# Patient Record
Sex: Male | Born: 1990 | Race: Black or African American | Hispanic: No | Marital: Single | Smoking: Current some day smoker
Health system: Southern US, Community
[De-identification: ages and names within clinical notes are randomized; demographics above are authoritative.]

---
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
## 2010-10-28 LAB — RAPID STREP SCREEN (MED CTR MEBANE ONLY): Streptococcus, Group A Screen (Direct): NEGATIVE

## 2016-02-16 ENCOUNTER — Emergency Department (HOSPITAL_COMMUNITY): Payer: Self-pay

## 2016-02-16 ENCOUNTER — Encounter (HOSPITAL_COMMUNITY): Payer: Self-pay | Admitting: Emergency Medicine

## 2016-02-16 ENCOUNTER — Emergency Department (HOSPITAL_COMMUNITY)
Admission: EM | Admit: 2016-02-16 | Discharge: 2016-02-16 | Disposition: A | Discharge status: Home / Self Care | Payer: Self-pay | Attending: Physician Assistant | Admitting: Physician Assistant

## 2016-02-16 DIAGNOSIS — N50811 Right testicular pain: Secondary | ICD-10-CM | POA: Insufficient documentation

## 2016-02-16 DIAGNOSIS — N50819 Testicular pain, unspecified: Secondary | ICD-10-CM

## 2016-02-16 DIAGNOSIS — N50812 Left testicular pain: Secondary | ICD-10-CM | POA: Insufficient documentation

## 2016-02-16 DIAGNOSIS — N452 Orchitis: Secondary | ICD-10-CM | POA: Insufficient documentation

## 2016-02-16 DIAGNOSIS — F17210 Nicotine dependence, cigarettes, uncomplicated: Secondary | ICD-10-CM | POA: Insufficient documentation

## 2016-02-16 LAB — URINALYSIS, ROUTINE W REFLEX MICROSCOPIC
Bilirubin Urine: NEGATIVE
Glucose, UA: NEGATIVE mg/dL
Hgb urine dipstick: NEGATIVE
Ketones, ur: NEGATIVE mg/dL
Leukocytes, UA: NEGATIVE
Nitrite: NEGATIVE
Protein, ur: NEGATIVE mg/dL
Specific Gravity, Urine: 1.024 (ref 1.005–1.030)
pH: 7 (ref 5.0–8.0)

## 2016-02-16 MED ORDER — CEFTRIAXONE SODIUM 250 MG IJ SOLR
250.0000 mg | Freq: Once | INTRAMUSCULAR | Status: AC
  Administered 2016-02-16: 250 mg via INTRAMUSCULAR
  Filled 2016-02-16: qty 250

## 2016-02-16 MED ORDER — LIDOCAINE HCL (PF) 1 % IJ SOLN
INTRAMUSCULAR | Status: AC
  Administered 2016-02-16: 5 mL
  Filled 2016-02-16: qty 5

## 2016-02-16 MED ORDER — DOXYCYCLINE HYCLATE 100 MG PO CAPS: 100.0000 mg | ORAL_CAPSULE | Freq: Two times a day (BID) | ORAL | 0 refills | Status: AC

## 2016-02-16 MED ORDER — AZITHROMYCIN 250 MG PO TABS
1000.0000 mg | ORAL_TABLET | Freq: Once | ORAL | Status: AC
  Administered 2016-02-16: 1000 mg via ORAL
  Filled 2016-02-16: qty 4

## 2016-02-16 NOTE — ED Provider Notes (Signed)
Patient with testicle pain.  Recently treated for STI.  US shows possible orchitis.  Given recent STI, doubt viral process.  Will give azithro, rocephin, and doxy.  Recommend [facility] Department follow-up.  Results for orders placed or performed during the hospital encounter of 02/16/16  Urinalysis, Routine w reflex microscopic  Result Value Ref Range   Color, Urine YELLOW YELLOW   APPearance CLEAR CLEAR   Specific Gravity, Urine 1.024 1.005 - 1.030   pH 7.0 5.0 - 8.0   Glucose, UA NEGATIVE NEGATIVE mg/dL   Hgb urine dipstick NEGATIVE NEGATIVE   Bilirubin Urine NEGATIVE NEGATIVE   Ketones, ur NEGATIVE NEGATIVE mg/dL   Protein, ur NEGATIVE NEGATIVE mg/dL   Nitrite NEGATIVE NEGATIVE   Leukocytes, UA NEGATIVE NEGATIVE   [place] Scrotum  Result Date: 02/16/2016 CLINICAL DATA:  Bilateral testicular pain for 3 days EXAM: SCROTAL ULTRASOUND DOPPLER ULTRASOUND OF THE TESTICLES TECHNIQUE: Complete ultrasound examination of the testicles, epididymis, and other scrotal structures was performed. Color and spectral Doppler ultrasound were also utilized to evaluate blood flow to the testicles. COMPARISON:  None. FINDINGS: Right testicle Measurements: 4.3 x 2.2 x 3.8 cm. No mass or microlithiasis visualized. Mild increased Doppler flow in the right testicle relative to the left as can be seen with mild orchitis. Left testicle Measurements: 4.5 x 1.8 x 2.4 cm. Two punctate calcification in the left testicle. No mass or microlithiasis visualized. Right epididymis:  Normal in size and appearance. Left epididymis:  Normal in size and appearance. Hydrocele:  None visualized. Varicocele:  None visualized. Pulsed Doppler interrogation of both testes demonstrates normal low resistance arterial and venous waveforms bilaterally. IMPRESSION: 1. No testicular torsion. 2. No testicular hematoma or abscess. 3. Mild increased Doppler flow in the right testicle relative to the left as can be seen with mild orchitis. Electronically  Signed   By: Elige KoHetal  Patel   On: 02/16/2016 17:12   [place] Art/ven Flow Abd Pelv Doppler  Result Date: 02/16/2016 CLINICAL DATA:  Bilateral testicular pain for 3 days EXAM: SCROTAL ULTRASOUND DOPPLER ULTRASOUND OF THE TESTICLES TECHNIQUE: Complete ultrasound examination of the testicles, epididymis, and other scrotal structures was performed. Color and spectral Doppler ultrasound were also utilized to evaluate blood flow to the testicles. COMPARISON:  None. FINDINGS: Right testicle Measurements: 4.3 x 2.2 x 3.8 cm. No mass or microlithiasis visualized. Mild increased Doppler flow in the right testicle relative to the left as can be seen with mild orchitis. Left testicle Measurements: 4.5 x 1.8 x 2.4 cm. Two punctate calcification in the left testicle. No mass or microlithiasis visualized. Right epididymis:  Normal in size and appearance. Left epididymis:  Normal in size and appearance. Hydrocele:  None visualized. Varicocele:  None visualized. Pulsed Doppler interrogation of both testes demonstrates normal low resistance arterial and venous waveforms bilaterally. IMPRESSION: 1. No testicular torsion. 2. No testicular hematoma or abscess. 3. Mild increased Doppler flow in the right testicle relative to the left as can be seen with mild orchitis. Electronically Signed   By: Elige KoHetal  Patel   On: 02/16/2016 17:12      Roxy Horsemanobert Makaylynn Bonillas, PA-C 02/16/16 1744    Courteney Lyn Mackuen, MD 02/17/16 [id number]

## 2016-02-16 NOTE — ED Triage Notes (Signed)
Was treated for std on 1/2.  That appears to have cleared per report but this is day two of pain in testicles.  Denies swelling, denies difficulty urinating.  Has not had sex since treatment.

## 2016-02-16 NOTE — ED Notes (Signed)
Declined W/C at D/C and was escorted to lobby by RN. 

## 2016-02-16 NOTE — ED Provider Notes (Signed)
[facility]-EMERGENCY DEPT Provider Note   CSN: [id number] Arrival date & time: 02/16/16  1320    By signing my name below, I, Daniel Figueroa, attest that this documentation has been prepared under the direction and in the presence of Daniel FarrierWilliam Oluwasemilore Pascuzzi, PA-C. Electronically Signed: Valentino SaxonBianca Figueroa, ED Scribe. 02/16/16. 3:31 PM.  History   Chief Complaint Chief Complaint  Patient presents with  . Testicle Pain   The history is provided by the patient. No language interpreter was used.   HPI Comments: Laural GoldenKenae J Figueroa is a 26 y.o. male who presents to the Emergency Department complaining of moderate, constant, bilateral testicular pain onset two days ago. Pt notes he [place] Zithromax on 01/28/16.  Pt describes his pain as if "someone kicked him". He reports having a tingling feeling at the tip of his penis when urinating.  Denies difficulty urinating,  fever, chills, rashes, lesions, abdominal pain, vomiting, nausea, sores in mouth.   History reviewed. No pertinent past medical history.  There are no active problems to display for this patient.   No past surgical history on file.     Home Medications    Prior to Admission medications   Not on File    Family History No family history on file.  Social History Social History  Substance Use Topics  . Smoking status: Current Some Day Smoker    Types: Cigarettes, Cigars  . Smokeless tobacco: Not on file  . Alcohol use Yes     Comment: on occ  weekends usually     Allergies   Patient has no allergy information on record.   Review of Systems Review of Systems  Constitutional: Negative for chills and fever.  HENT: Negative for mouth sores.   Gastrointestinal: Negative for abdominal pain, nausea and vomiting.  Genitourinary: Positive for testicular pain. Negative for decreased urine volume, difficulty urinating, discharge, dysuria, flank pain, frequency, genital sores, hematuria, penile  pain and urgency.  Skin: Negative for rash.     Physical Exam Updated Vital Signs BP 127/89 (BP Location: Right Arm)   Pulse 82   Temp 98.2 F (36.8 C) (Oral)   Resp 16   Ht 5\' 11"  (1.803 m)   Wt 155 lb (70.3 kg)   SpO2 100%   BMI 21.62 kg/m   Physical Exam  Constitutional: He appears well-developed and well-nourished. No distress.  HENT:  Head: Normocephalic and atraumatic.  Mouth/Throat: Oropharynx is clear and moist.  Eyes: Right eye exhibits no discharge. Left eye exhibits no discharge.  Cardiovascular: Normal rate, regular rhythm and intact distal pulses.   Pulmonary/Chest: Effort normal. No respiratory distress.  Abdominal: Soft. He exhibits no mass. There is no tenderness. There is no guarding.  Abdomen soft nontender to palpation.  Genitourinary: Penis normal. No penile tenderness.  Genitourinary Comments: GU exam with male scribe chaperone. No external lesions or rashes noted. Penis is nontender to palpation. Testicles are nontender to palpation. Tenderness is nontender to palpation. Cremasteric reflex is present. No penile discharge noted.  Neurological: He is alert. Coordination normal.  Skin: Skin is warm and dry. Capillary refill takes less than 2 seconds. No rash noted. He is not diaphoretic. No erythema. No pallor.  Psychiatric: He has a normal mood and affect. His behavior is normal.  Nursing note and vitals reviewed.    ED Treatments / Results   DIAGNOSTIC STUDIES: Oxygen Saturation is 100% on RA, normal by my interpretation.    COORDINATION OF CARE: 3:20 PM Discussed  treatment plan with pt at bedside which includes labs and [place] and pt agreed to plan.   Labs (all labs ordered are listed, but only abnormal results are displayed) Labs Reviewed  URINALYSIS, ROUTINE W REFLEX MICROSCOPIC  GC/CHLAMYDIA PROBE AMP ([place]) NOT AT [facility]    EKG  EKG Interpretation None       Radiology No results found.  Procedures Procedures (including  critical care time)  Medications Ordered in ED Medications - No data to display   Initial Impression / Assessment and Plan / ED Course  I have reviewed the triage vital signs and the nursing notes.  Pertinent labs & imaging results that were available during my care of the patient were reviewed by me and considered in my medical decision making (see chart for details).    This is a 26 y.o. male who presents to the Emergency Department complaining of moderate, constant, bilateral testicular pain onset two days ago. Pt notes he was treated for chlamaydia in [place] with Zithromax on 01/28/16.  Pt describes his pain as if "someone kicked him". He reports having a tingling feeling at the tip of his penis when urinating. On exam the patient is afebrile nontoxic appearing. GU exam is unremarkable. No penile discharge. No testicular tenderness to palpation. Epididymis is nontender bilaterally. Cremasteric reflexes present. I saw the patient for gonorrhea and chlamydia and sent this for testing. Will order urinalysis and scrotal ultrasound.  At shift change patient is awaiting urinalysis and ultrasound. Patient care signed out to Daniel Horseman, PA-C at shift change. He will disposition the patient accordingly.  Final Clinical Impressions(s) / ED Diagnoses   Final diagnoses:  Testicle pain  Testicle pain    New Prescriptions New Prescriptions   No medications on file    I personally performed the services described in this documentation, which was scribed in my presence. The recorded information has been reviewed and is accurate.       Daniel Farrier, PA-C 02/17/16 1610    Daniel Randall An, MD 02/17/16 [id number]

## 2016-02-16 NOTE — ED Notes (Signed)
Handed Will, PA genital swab for procedure

## 2016-02-17 LAB — GC/CHLAMYDIA PROBE AMP (~~LOC~~) NOT AT ARMC
Chlamydia: POSITIVE — AB
Neisseria Gonorrhea: NEGATIVE

## 2017-12-22 ENCOUNTER — Encounter (HOSPITAL_COMMUNITY): Payer: Self-pay

## 2017-12-22 ENCOUNTER — Emergency Department (HOSPITAL_COMMUNITY)
Admission: EM | Admit: 2017-12-22 | Discharge: 2017-12-23 | Disposition: A | Discharge status: Home / Self Care | Payer: Self-pay | Attending: Emergency Medicine | Admitting: Emergency Medicine

## 2017-12-22 DIAGNOSIS — Y990 Civilian activity done for income or pay: Secondary | ICD-10-CM | POA: Insufficient documentation

## 2017-12-22 DIAGNOSIS — S76211A Strain of adductor muscle, fascia and tendon of right thigh, initial encounter: Secondary | ICD-10-CM | POA: Insufficient documentation

## 2017-12-22 DIAGNOSIS — F17210 Nicotine dependence, cigarettes, uncomplicated: Secondary | ICD-10-CM | POA: Insufficient documentation

## 2017-12-22 DIAGNOSIS — Z79899 Other long term (current) drug therapy: Secondary | ICD-10-CM | POA: Insufficient documentation

## 2017-12-22 DIAGNOSIS — Y9389 Activity, other specified: Secondary | ICD-10-CM | POA: Insufficient documentation

## 2017-12-22 DIAGNOSIS — X500XXA Overexertion from strenuous movement or load, initial encounter: Secondary | ICD-10-CM | POA: Insufficient documentation

## 2017-12-22 DIAGNOSIS — Y929 Unspecified place or not applicable: Secondary | ICD-10-CM | POA: Insufficient documentation

## 2017-12-22 DIAGNOSIS — T148XXA Other injury of unspecified body region, initial encounter: Secondary | ICD-10-CM

## 2017-12-22 NOTE — ED Triage Notes (Signed)
Pt states that for the past month he has had pain on and off to his r groin area after pulling a muscle, denies dysuria. States that his friend had a hernia and he wants to make sure he does not have the same thing

## 2017-12-23 MED ORDER — IBUPROFEN 800 MG PO TABS: 800.0000 mg | ORAL_TABLET | Freq: Three times a day (TID) | ORAL | 0 refills | Status: AC

## 2017-12-23 NOTE — ED Notes (Signed)
Patient verbalizes understanding of medications and discharge instructions. No further questions at this time. VSS and patient ambulatory at discharge.   

## 2017-12-23 NOTE — ED Provider Notes (Signed)
[facility] EMERGENCY DEPARTMENT Provider Note   CSN: [id number] Arrival date & time: 12/22/17  2323     History   Chief Complaint Chief Complaint  Patient presents with  . Groin Pain    HPI Daniel Figueroa is a 27 y.o. male.  Patient presents to the emergency department with chief complaint of right-sided groin and hip pain.  Patient states that he felt like he strained a muscle while working approximately 1 month ago.  Reports having increased pain with movement and palpation.  States that his work requires him to be active and lifting things.  He denies any dysuria or hematuria.  Denies any flank pain.  States that he is concerned because his friend had a hernia and he wants to make certain he does not have a hernia.  The history is provided by the patient. No language interpreter was used.    History reviewed. No pertinent past medical history.  There are no active problems to display for this patient.   History reviewed. No pertinent surgical history.      Home Medications    Prior to Admission medications   Medication Sig Start Date End Date Taking? Authorizing Provider  doxycycline (VIBRAMYCIN) 100 MG capsule Take 1 capsule (100 mg total) by mouth 2 (two) times daily. 02/16/16   Roxy Horseman, PA-C    Family History No family history on file.  Social History Social History   Tobacco Use  . Smoking status: Current Some Day Smoker    Types: Cigarettes, Cigars  . Smokeless tobacco: Never Used  Substance Use Topics  . Alcohol use: Yes    Comment: on occ  weekends usually  . Drug use: Yes    Types: Marijuana     Allergies   Patient has no known allergies.   Review of Systems Review of Systems  All other systems reviewed and are negative.    Physical Exam Updated Vital Signs BP 119/83   Pulse (!) 58   Temp 98 F (36.7 C) (Oral)   Resp 20   SpO2 100%   Physical Exam  Constitutional: He is oriented to person, place, and  time. He appears well-developed and well-nourished.  HENT:  Head: Normocephalic and atraumatic.  Eyes: Conjunctivae and EOM are normal.  Neck: Normal range of motion.  Cardiovascular: Normal rate.  Pulmonary/Chest: Effort normal.  Abdominal: He exhibits no distension.  No focal abdominal tenderness  Genitourinary:  Genitourinary Comments: Chaperone present for exam, normal anatomy, no masses, no hernias, no lymphadenopathy  Musculoskeletal: Normal range of motion.  Neurological: He is alert and oriented to person, place, and time.  Skin: Skin is dry.  Psychiatric: He has a normal mood and affect. His behavior is normal. Judgment and thought content normal.  Nursing note and vitals reviewed.    ED Treatments / Results  Labs (all labs ordered are listed, but only abnormal results are displayed) Labs Reviewed - No data to display  EKG None  Radiology No results found.  Procedures Procedures (including critical care time)  Medications Ordered in ED Medications - No data to display   Initial Impression / Assessment and Plan / ED Course  I have reviewed the triage vital signs and the nursing notes.  Pertinent labs & imaging results that were available during my care of the patient were reviewed by me and considered in my medical decision making (see chart for details).     Patient with right-sided hip/groin pain.  Likely overuse/muscle strain  from his work.  Will try anti-inflammatories.  Recommend ice and rest.  No evidence of hernia.  Vital signs stable.  No dysuria or hematuria, do not feel that any additional ED work-up is indicated given the length of time since onset of symptoms.  Final Clinical Impressions(s) / ED Diagnoses   Final diagnoses:  Muscle strain    ED Discharge Orders         Ordered    ibuprofen (ADVIL,MOTRIN) 800 MG tablet  3 times daily     12/23/17 0035           Roxy HorsemanBrowning, Terek Bee, PA-C 12/23/17 0038    Derwood KaplanNanavati, Ankit, MD 12/23/17  [phone]

## 2018-09-28 IMAGING — US US ART/VEN ABD/PELV/SCROTUM DOPPLER LTD
1 series · 13 of 25 positions shown · non-contrast
Comparison: None.

CLINICAL DATA: Bilateral testicular pain for 3 days

EXAM:
SCROTAL ULTRASOUND
DOPPLER ULTRASOUND OF THE TESTICLES
TECHNIQUE: Complete ultrasound examination of the testicles, epididymis, and
other scrotal structures was performed. Color and spectral Doppler
ultrasound were also utilized to evaluate blood flow to the
testicles.

[Series 1: us art/ven abd/pelv/scrotum doppler ltd · 0.06mm/px · 13 of 48 slices shown]
[im 1/48]
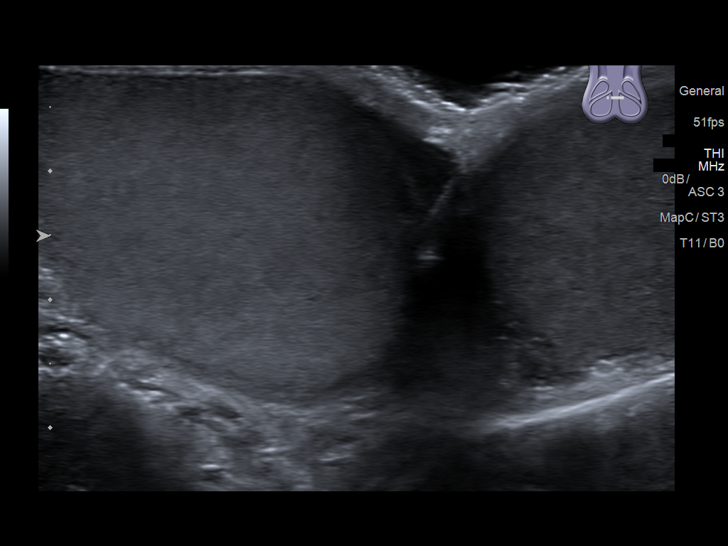
[im 4/48]
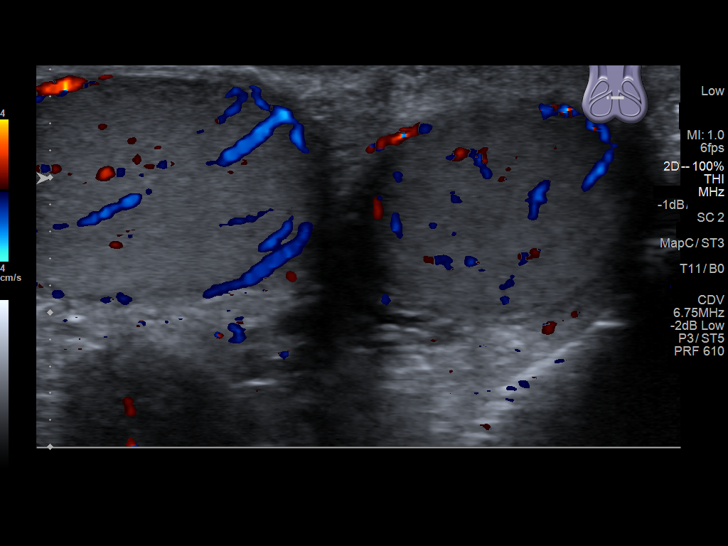
[im 8/48]
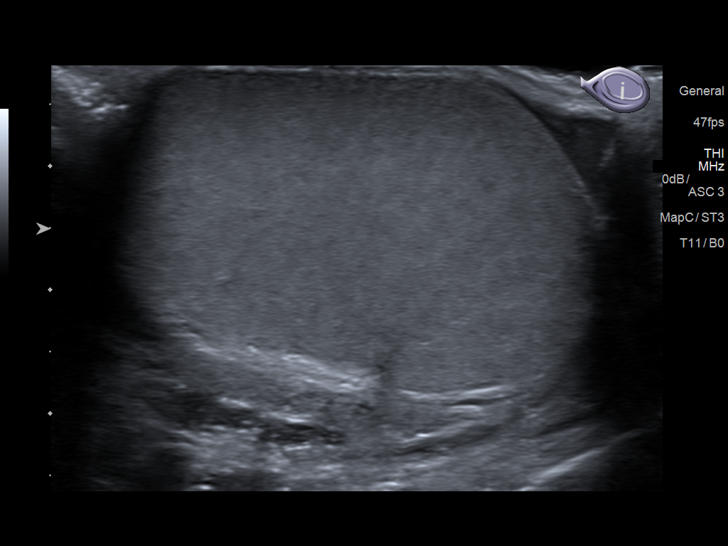
[im 12/48]
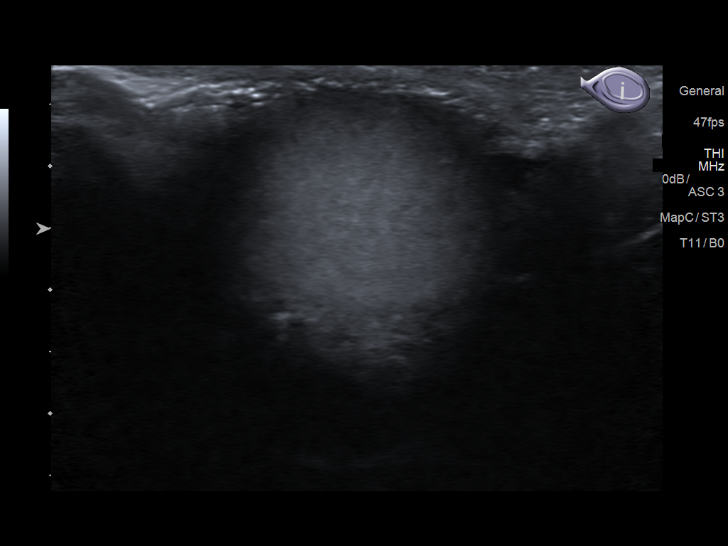
[im 16/48]
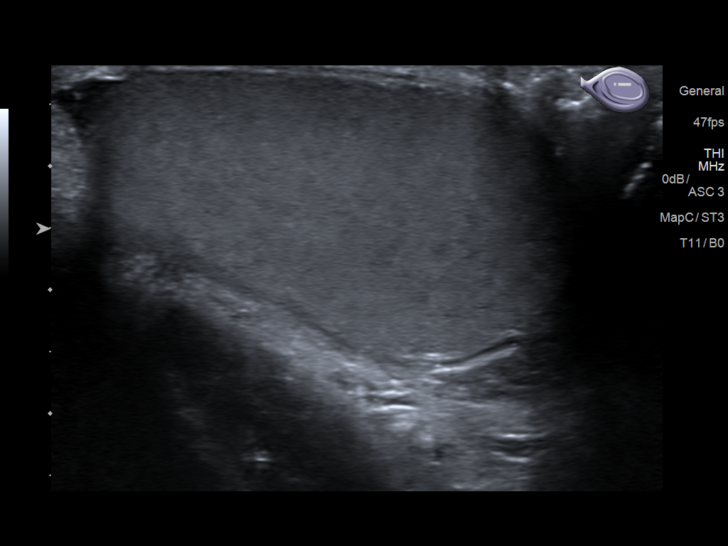
[im 20/48]
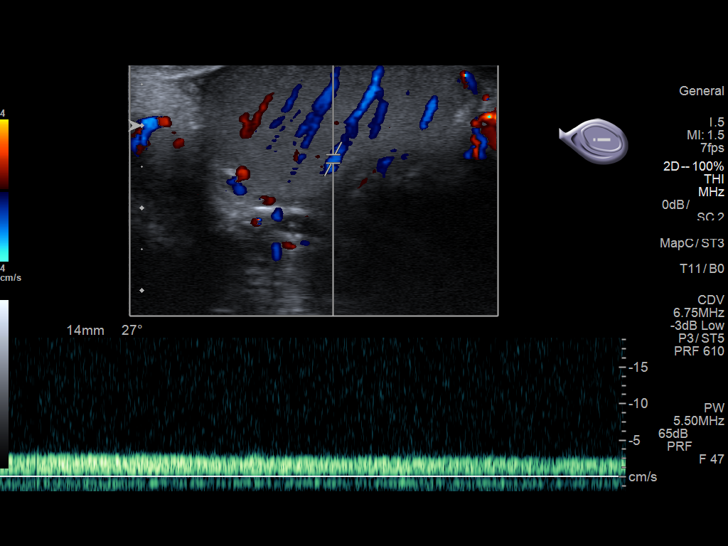
[im 24/48]
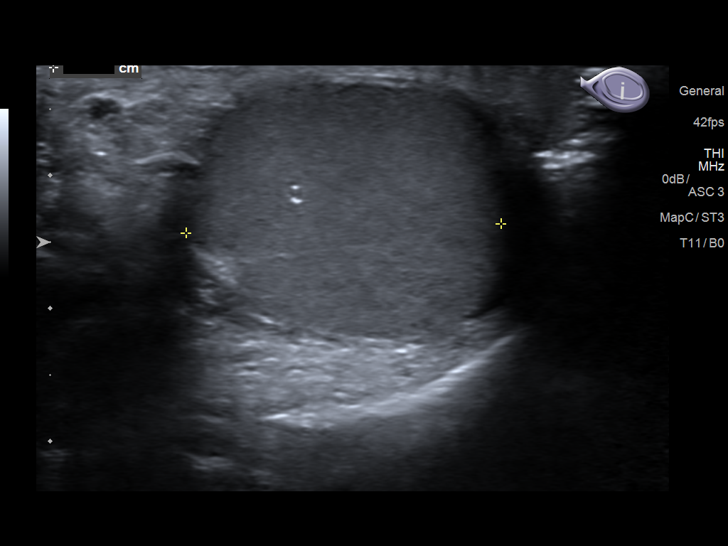
[im 28/48]
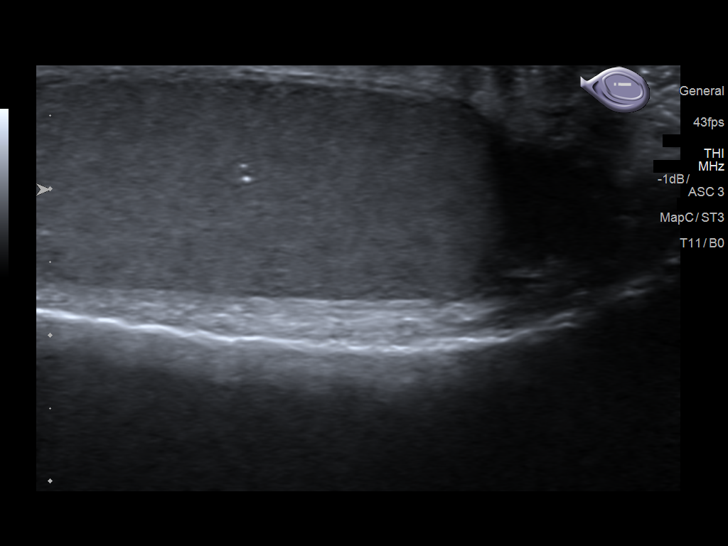
[im 32/48]
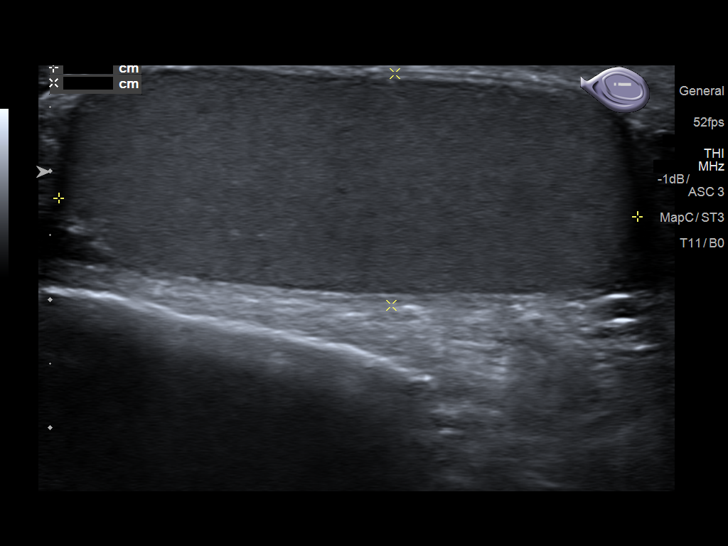
[im 36/48]
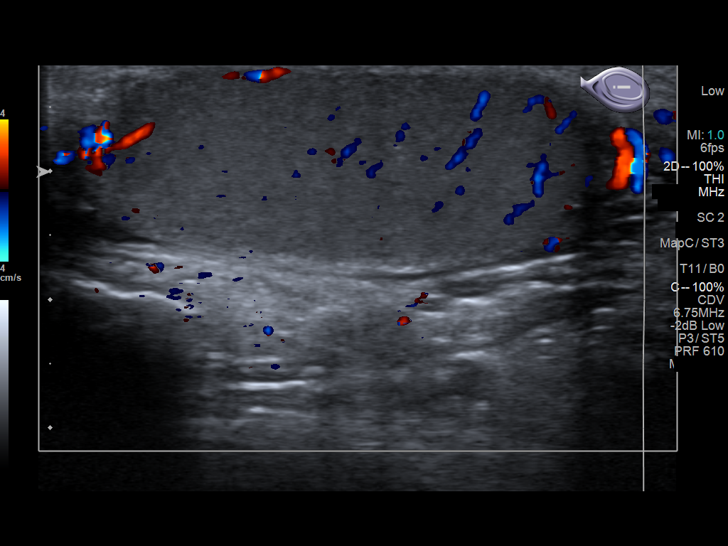
[im 40/48]
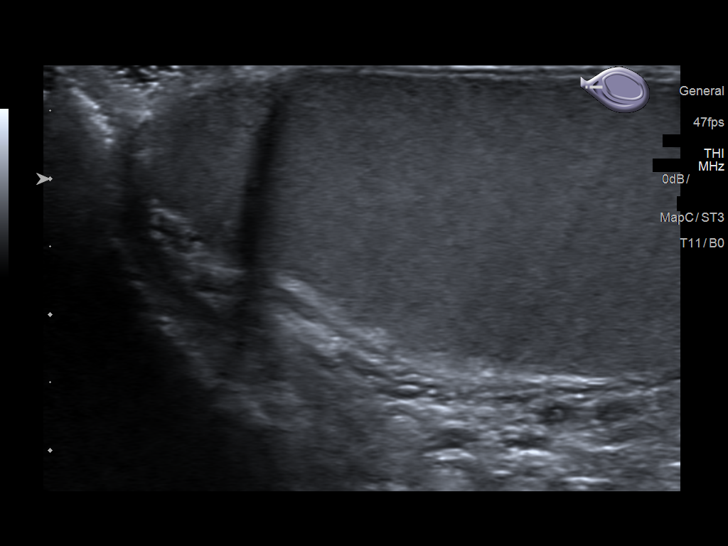
[im 44/48]
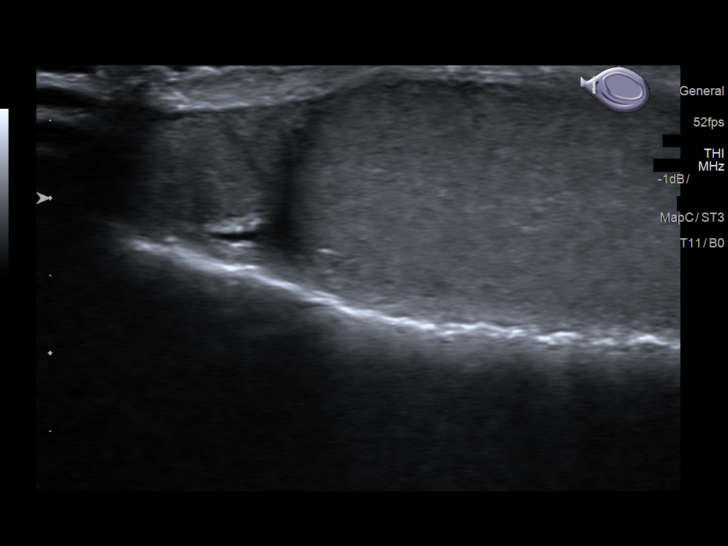
[im 48/48]
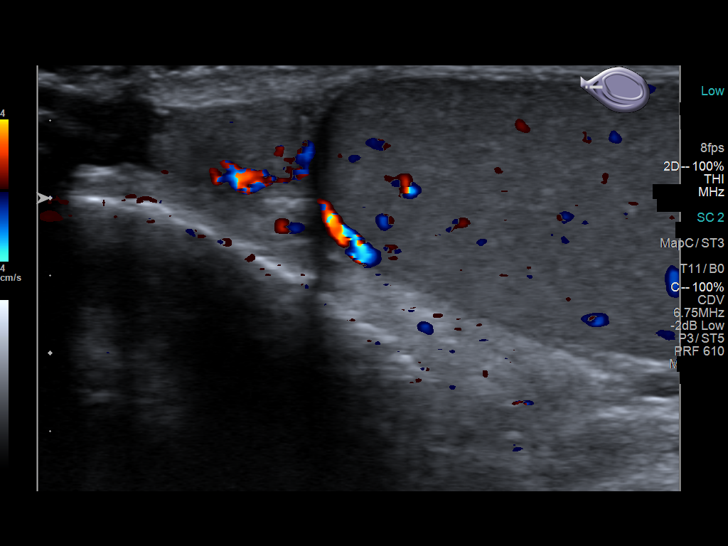

[13 of 25 positions shown; findings below may reference images not displayed]

FINDINGS: Right testicle

Measurements: 4.3 x 2.2 x 3.8 cm. No mass or microlithiasis
visualized. Mild increased Doppler flow in the right testicle
relative to the left as can be seen with mild orchitis.

Left testicle

Measurements: 4.5 x 1.8 x 2.4 cm. Two punctate calcification in the
left testicle.. No mass or microlithiasis visualized.

Right epididymis:  Normal in size and appearance.

Left epididymis:  Normal in size and appearance.

Hydrocele:  None visualized.

Varicocele:  None visualized.

Pulsed Doppler interrogation of both testes demonstrates normal low
resistance arterial and venous waveforms bilaterally.
IMPRESSION: 1. No testicular torsion.
2. No testicular hematoma or abscess.
3. Mild increased Doppler flow in the right testicle relative to the
left as can be seen with mild orchitis.

## 2020-10-12 ENCOUNTER — Encounter (HOSPITAL_COMMUNITY): Payer: Self-pay | Admitting: Emergency Medicine

## 2020-10-12 ENCOUNTER — Emergency Department (HOSPITAL_COMMUNITY)
Admission: EM | Admit: 2020-10-12 | Discharge: 2020-10-12 | Disposition: A | Discharge status: Home / Self Care | Payer: 59 | Attending: Emergency Medicine | Admitting: Emergency Medicine

## 2020-10-12 ENCOUNTER — Other Ambulatory Visit: Payer: Self-pay

## 2020-10-12 ENCOUNTER — Emergency Department (HOSPITAL_COMMUNITY): Payer: 59

## 2020-10-12 DIAGNOSIS — N50812 Left testicular pain: Secondary | ICD-10-CM | POA: Diagnosis not present

## 2020-10-12 DIAGNOSIS — N50811 Right testicular pain: Secondary | ICD-10-CM | POA: Insufficient documentation

## 2020-10-12 DIAGNOSIS — F17210 Nicotine dependence, cigarettes, uncomplicated: Secondary | ICD-10-CM | POA: Diagnosis not present

## 2020-10-12 DIAGNOSIS — N50819 Testicular pain, unspecified: Secondary | ICD-10-CM

## 2020-10-12 LAB — URINALYSIS, ROUTINE W REFLEX MICROSCOPIC
Bilirubin Urine: NEGATIVE
Glucose, UA: NEGATIVE mg/dL
Hgb urine dipstick: NEGATIVE
Ketones, ur: NEGATIVE mg/dL
Leukocytes,Ua: NEGATIVE
Nitrite: NEGATIVE
Protein, ur: NEGATIVE mg/dL
Specific Gravity, Urine: 1.025 (ref 1.005–1.030)
pH: 6 (ref 5.0–8.0)

## 2020-10-12 NOTE — ED Provider Notes (Signed)
[facility] EMERGENCY DEPARTMENT Provider Note   CSN: [id number] Arrival date & time: 10/12/20  0932     History Chief Complaint  Patient presents with   Testicle Pain    Daniel Figueroa is a 30 y.o. male presenting for evaluation of testicular discomfort.  Patient states he has had issues with testicular pain for over a month.  He has worked been working with his PCP to try and identify the cause.  Initially he also had dysuria, this was resolved after being on doxycycline.  He has had multiple negative STI tests.  He states he continues to have bilateral testicular pain, sometimes worse on 1 side than the other.  When the pain comes on, it is an 8 out of 10 and a heavy feeling.  He does not have pain all the time however.  There is nothing he can do that triggers the pain.  It is not worse with urination.  He is not having any fevers, nausea, vomiting, dysuria, hematuria, urinary frequency.  No recent URI sxs. He has an appointment with urology scheduled for 10 days from now.  He otherwise has no medical problems, takes medications daily.  He has tried Tylenol and ibuprofen without significant improvement of symptoms.   HPI     History reviewed. No pertinent past medical history.  There are no problems to display for this patient.   History reviewed. No pertinent surgical history.     No family history on file.  Social History   Tobacco Use   Smoking status: Some Days    Types: Cigarettes, Cigars   Smokeless tobacco: Never  Substance Use Topics   Alcohol use: Yes    Comment: on occ  weekends usually   Drug use: Yes    Types: Marijuana    Home Medications Prior to Admission medications   Medication Sig Start Date End Date Taking? Authorizing Provider  doxycycline (VIBRAMYCIN) 100 MG capsule Take 1 capsule (100 mg total) by mouth 2 (two) times daily. 02/16/16   Roxy Horseman, PA-C  ibuprofen (ADVIL,MOTRIN) 800 MG tablet Take 1 tablet (800 mg  total) by mouth 3 (three) times daily. 12/23/17   Roxy Horseman, PA-C    Allergies    Patient has no known allergies.  Review of Systems   Review of Systems  Genitourinary:  Positive for testicular pain.  All other systems reviewed and are negative.  Physical Exam Updated Vital Signs BP 128/75 (BP Location: Left Arm)   Pulse 67   Temp 98.1 F (36.7 C) (Oral)   Resp 17   SpO2 100%   Physical Exam Vitals and nursing note reviewed. Exam conducted with a chaperone present.  Constitutional:      General: He is not in acute distress.    Appearance: Normal appearance.     Comments: Resting in the bed in NAD  HENT:     Head: Normocephalic and atraumatic.  Eyes:     Conjunctiva/sclera: Conjunctivae normal.     Pupils: Pupils are equal, round, and reactive to light.  Cardiovascular:     Rate and Rhythm: Normal rate and regular rhythm.     Pulses: Normal pulses.  Pulmonary:     Effort: Pulmonary effort is normal. No respiratory distress.     Breath sounds: Normal breath sounds. No wheezing.     Comments: Speaking in full sentences.  Clear lung sounds in all fields. Abdominal:     General: There is no distension.  Palpations: Abdomen is soft. There is no mass.     Tenderness: There is no abdominal tenderness. There is no guarding or rebound.  Genitourinary:    Epididymis:     Right: Normal.     Left: Normal.     Comments: No testicular swelling.  No pain along the epididymis.  No lesions.  No penile discharge. Pt declining STI testing Musculoskeletal:        General: Normal range of motion.     Cervical back: Normal range of motion and neck supple.  Lymphadenopathy:     Lower Body: No right inguinal adenopathy. No left inguinal adenopathy.  Skin:    General: Skin is warm and dry.     Capillary Refill: Capillary refill takes less than 2 seconds.  Neurological:     Mental Status: He is alert and oriented to person, place, and time.  Psychiatric:        Mood and  Affect: Mood and affect normal.        Speech: Speech normal.        Behavior: Behavior normal.    ED Results / Procedures / Treatments   Labs (all labs ordered are listed, but only abnormal results are displayed) Labs Reviewed  URINE CULTURE  URINALYSIS, ROUTINE W REFLEX MICROSCOPIC    EKG None  Radiology US SCROTUM W/DOPPLER  Result Date: 10/12/2020 CLINICAL DATA:  Testicular pain. EXAM: SCROTAL ULTRASOUND DOPPLER ULTRASOUND OF THE TESTICLES TECHNIQUE: Complete ultrasound examination of the testicles, epididymis, and other scrotal structures was performed. Color and spectral Doppler ultrasound were also utilized to evaluate blood flow to the testicles. COMPARISON:  None. FINDINGS: Right testicle Measurements: 4.3 x 2.0 x 3.1 cm. Scattered microliths identified in the parenchyma. No intraparenchymal mass lesion. Left testicle Measurements: 3.9 x 1.8 x 3.0 cm.Scattered microliths evident. No testicular mass lesion. Right epididymis:  Normal in size and appearance. Left epididymis:  Normal in size and appearance. Hydrocele:  None visualized. Varicocele:  None visualized. Pulsed Doppler interrogation of both testes demonstrates normal low resistance arterial and venous waveforms bilaterally. IMPRESSION: No findings to explain the patient's history of testicular pain. No evidence for testicular torsion or epididymo-orchitis. 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 Current literature suggests that testicular microlithiasis is not a significant independent risk factor for development of testicular carcinoma, and that follow up imaging is not warranted in the absence of other risk factors. Monthly testicular self-examination and annual physical exams are considered appropriate surveillance. If patient has other risk factors for testicular carcinoma, then referral to Urology should be considered. (Reference: DeCastro, et al.: A 5-Year Follow up Study of Asymptomatic Men with Testicular  Microlithiasis. J Urol 2008; 179:1420-1423.) Electronically Signed   By: Kennith Center M.D.   On: 10/12/2020 11:35    Procedures Procedures   Medications Ordered in ED Medications - No data to display  ED Course  I have reviewed the triage vital signs and the nursing notes.  Pertinent labs & imaging results that were available during my care of the patient were reviewed by me and considered in my medical decision making (see chart for details).    MDM Rules/Calculators/A&P                           Patient presenting for evaluation of testicular heaviness and pain.  On exam, he appears nontoxic.  GU exam is overall reassuring.  No lesions.  Patient reports multiple negative STI test, declining today.  Will check urine.  Patient does have a history of orchitis, will repeat ultrasound although history and physical is less consistent with this.  Urine negative for infection.  Ultrasound negative for acute findings, does show microlithiasis which is likely not contributing to his symptoms.  Discussed findings with patient.  Discussed follow-up with urology as scheduled.  At this time, patient appears safe for discharge.  Return precautions given.  Patient and mom state they understand and agree to plan.  Final Clinical Impression(s) / ED Diagnoses Final diagnoses:  Pain in testicle, unspecified laterality    Rx / DC Orders ED Discharge Orders     None        Alveria Apley, PA-C 10/12/20 1530    Linwood Dibbles, MD 10/13/20 1003

## 2020-10-12 NOTE — ED Triage Notes (Signed)
Pt reports testicle pain x 2 months.  He has seen his PCP for same and been treated for STDs but states testing came back negative.

## 2020-10-12 NOTE — ED Notes (Signed)
Patient transported to Ultrasound 

## 2020-10-12 NOTE — Discharge Instructions (Addendum)
Continue taking Tylenol or ibuprofen up to 3 times a day with meals as needed for pain. Make sure you stay well-hydrated with water. Follow-up with the urologist at your scheduled appointment.  You may also call the urologist listed below to try and get in sooner, however they may not have availability before your appointment. Return to the emergency room if he develop fevers, blood in your urine, pain with urination, inability to urinate, any new commotion, or concerning symptoms.

## 2020-10-13 LAB — URINE CULTURE: Culture: NO GROWTH

## 2021-08-17 ENCOUNTER — Emergency Department (HOSPITAL_BASED_OUTPATIENT_CLINIC_OR_DEPARTMENT_OTHER): Payer: 59

## 2021-08-17 ENCOUNTER — Other Ambulatory Visit: Payer: Self-pay

## 2021-08-17 ENCOUNTER — Encounter (HOSPITAL_BASED_OUTPATIENT_CLINIC_OR_DEPARTMENT_OTHER): Payer: Self-pay | Admitting: Emergency Medicine

## 2021-08-17 ENCOUNTER — Emergency Department (HOSPITAL_BASED_OUTPATIENT_CLINIC_OR_DEPARTMENT_OTHER)
Admission: EM | Admit: 2021-08-17 | Discharge: 2021-08-17 | Disposition: A | Discharge status: Home / Self Care | Payer: 59 | Attending: Emergency Medicine | Admitting: Emergency Medicine

## 2021-08-17 DIAGNOSIS — T310 Burns involving less than 10% of body surface: Secondary | ICD-10-CM | POA: Insufficient documentation

## 2021-08-17 DIAGNOSIS — S2242XA Multiple fractures of ribs, left side, initial encounter for closed fracture: Secondary | ICD-10-CM | POA: Insufficient documentation

## 2021-08-17 DIAGNOSIS — S80812A Abrasion, left lower leg, initial encounter: Secondary | ICD-10-CM | POA: Insufficient documentation

## 2021-08-17 DIAGNOSIS — R7401 Elevation of levels of liver transaminase levels: Secondary | ICD-10-CM | POA: Diagnosis not present

## 2021-08-17 DIAGNOSIS — T07XXXA Unspecified multiple injuries, initial encounter: Secondary | ICD-10-CM

## 2021-08-17 DIAGNOSIS — X19XXXA Contact with other heat and hot substances, initial encounter: Secondary | ICD-10-CM | POA: Insufficient documentation

## 2021-08-17 DIAGNOSIS — Y9355 Activity, bike riding: Secondary | ICD-10-CM | POA: Diagnosis not present

## 2021-08-17 DIAGNOSIS — T2122XA Burn of second degree of abdominal wall, initial encounter: Secondary | ICD-10-CM | POA: Insufficient documentation

## 2021-08-17 DIAGNOSIS — S80811A Abrasion, right lower leg, initial encounter: Secondary | ICD-10-CM | POA: Insufficient documentation

## 2021-08-17 DIAGNOSIS — S29001A Unspecified injury of muscle and tendon of front wall of thorax, initial encounter: Secondary | ICD-10-CM | POA: Diagnosis present

## 2021-08-17 LAB — COMPREHENSIVE METABOLIC PANEL
ALT: 45 U/L — ABNORMAL HIGH (ref 0–44)
AST: 53 U/L — ABNORMAL HIGH (ref 15–41)
Albumin: 5.1 g/dL — ABNORMAL HIGH (ref 3.5–5.0)
Alkaline Phosphatase: 53 U/L (ref 38–126)
Anion gap: 11 (ref 5–15)
BUN: 18 mg/dL (ref 6–20)
CO2: 26 mmol/L (ref 22–32)
Calcium: 9.9 mg/dL (ref 8.9–10.3)
Chloride: 102 mmol/L (ref 98–111)
Creatinine, Ser: 1.32 mg/dL — ABNORMAL HIGH (ref 0.61–1.24)
GFR, Estimated: >60 mL/min (ref >60)
Glucose, Bld: 126 mg/dL — ABNORMAL HIGH (ref 70–99)
Potassium: 3.7 mmol/L (ref 3.5–5.1)
Sodium: 139 mmol/L (ref 135–145)
Total Bilirubin: 0.3 mg/dL (ref 0.3–1.2)
Total Protein: 7.9 g/dL (ref 6.5–8.1)

## 2021-08-17 LAB — CBC WITH DIFFERENTIAL/PLATELET
Abs Immature Granulocytes: 0.06 10*3/uL (ref 0.00–0.07)
Basophils Absolute: 0 10*3/uL (ref 0.0–0.1)
Basophils Relative: 0 %
Eosinophils Absolute: 0.2 10*3/uL (ref 0.0–0.5)
Eosinophils Relative: 2 %
HCT: 43.2 % (ref 39.0–52.0)
Hemoglobin: 14.8 g/dL (ref 13.0–17.0)
Immature Granulocytes: 1 %
Lymphocytes Relative: 33 %
Lymphs Abs: 2.3 10*3/uL (ref 0.7–4.0)
MCH: 28.1 pg (ref 26.0–34.0)
MCHC: 34.3 g/dL (ref 30.0–36.0)
MCV: 82 fL (ref 80.0–100.0)
Monocytes Absolute: 0.4 10*3/uL (ref 0.1–1.0)
Monocytes Relative: 6 %
Neutro Abs: 4 10*3/uL (ref 1.7–7.7)
Neutrophils Relative %: 58 %
Platelets: 215 10*3/uL (ref 150–400)
RBC: 5.27 MIL/uL (ref 4.22–5.81)
RDW: 13.8 % (ref 11.5–15.5)
WBC: 6.9 10*3/uL (ref 4.0–10.5)
nRBC: 0 % (ref 0.0–0.2)

## 2021-08-17 LAB — LIPASE, BLOOD: Lipase: 34 U/L (ref 11–51)

## 2021-08-17 MED ORDER — ONDANSETRON 4 MG PO TBDP: 4.0000 mg | ORAL_TABLET | Freq: Three times a day (TID) | ORAL | 0 refills | Status: AC | PRN

## 2021-08-17 MED ORDER — ACETAMINOPHEN 325 MG PO TABS
650.0000 mg | ORAL_TABLET | Freq: Once | ORAL | Status: AC
  Administered 2021-08-17: 650 mg via ORAL
  Filled 2021-08-17: qty 2

## 2021-08-17 MED ORDER — OXYCODONE HCL 5 MG PO TABS: 2.5000 mg | ORAL_TABLET | Freq: Four times a day (QID) | ORAL | 0 refills | Status: AC | PRN

## 2021-08-17 MED ORDER — MORPHINE SULFATE (PF) 4 MG/ML IV SOLN
4.0000 mg | Freq: Once | INTRAVENOUS | Status: AC
  Administered 2021-08-17: 4 mg via INTRAVENOUS
  Filled 2021-08-17: qty 1

## 2021-08-17 MED ORDER — BACITRACIN ZINC 500 UNIT/GM EX OINT: 1.0000 | TOPICAL_OINTMENT | Freq: Two times a day (BID) | CUTANEOUS | 0 refills | Status: AC

## 2021-08-17 MED ORDER — IOHEXOL 300 MG/ML  SOLN
100.0000 mL | Freq: Once | INTRAMUSCULAR | Status: AC | PRN
  Administered 2021-08-17: 100 mL via INTRAVENOUS

## 2021-08-17 MED ORDER — HYDROCODONE-ACETAMINOPHEN 5-325 MG PO TABS
1.0000 | ORAL_TABLET | Freq: Once | ORAL | Status: AC
  Administered 2021-08-17: 1 via ORAL
  Filled 2021-08-17: qty 1

## 2021-08-17 MED ORDER — SODIUM CHLORIDE 0.9 % IV BOLUS
1000.0000 mL | Freq: Once | INTRAVENOUS | Status: AC
  Administered 2021-08-17: 1000 mL via INTRAVENOUS

## 2021-08-17 NOTE — ED Notes (Signed)
Patient transported to CT 
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
## 2021-08-17 NOTE — Discharge Instructions (Addendum)
For pain control you may take at 1000 mg of Tylenol every 8 hours scheduled.  In addition you can take 0.5 to 1 tablet of Oxycodone every 6 hours as needed for pain not controlled with the scheduled Tylenol.  Incentive spirometer: Use frequently to prevent pneumonia.  Recommended usage: 10 deep inhalations through spirometer every 2-3 hours for 7-10 days.  

## 2021-08-17 NOTE — ED Notes (Signed)
RT educated pt on incentive spirometer. Pt able to exceed goal of 1750 mL w/obtaining 2500 mL. Pt verbalizes understanding of teaching.
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
## 2023-06-13 ENCOUNTER — Encounter (HOSPITAL_BASED_OUTPATIENT_CLINIC_OR_DEPARTMENT_OTHER): Payer: Self-pay

## 2023-06-13 ENCOUNTER — Other Ambulatory Visit: Payer: Self-pay

## 2023-06-13 ENCOUNTER — Emergency Department (HOSPITAL_BASED_OUTPATIENT_CLINIC_OR_DEPARTMENT_OTHER)
Admission: EM | Admit: 2023-06-13 | Discharge: 2023-06-13 | Disposition: A | Discharge status: Home / Self Care | Attending: Emergency Medicine | Admitting: Emergency Medicine

## 2023-06-13 DIAGNOSIS — N200 Calculus of kidney: Secondary | ICD-10-CM | POA: Insufficient documentation

## 2023-06-13 DIAGNOSIS — R109 Unspecified abdominal pain: Secondary | ICD-10-CM | POA: Diagnosis present

## 2023-06-13 LAB — COMPREHENSIVE METABOLIC PANEL WITH GFR
ALT: 19 U/L (ref 0–44)
AST: 32 U/L (ref 15–41)
Albumin: 5 g/dL (ref 3.5–5.0)
Alkaline Phosphatase: 72 U/L (ref 38–126)
Anion gap: 15 (ref 5–15)
BUN: 12 mg/dL (ref 6–20)
CO2: 26 mmol/L (ref 22–32)
Calcium: 10.4 mg/dL — ABNORMAL HIGH (ref 8.9–10.3)
Chloride: 103 mmol/L (ref 98–111)
Creatinine, Ser: 1.19 mg/dL (ref 0.61–1.24)
GFR, Estimated: >60 mL/min (ref >60)
Glucose, Bld: 98 mg/dL (ref 70–99)
Potassium: 4.1 mmol/L (ref 3.5–5.1)
Sodium: 143 mmol/L (ref 135–145)
Total Bilirubin: <0.2 mg/dL (ref 0.0–1.2)
Total Protein: 8.6 g/dL — ABNORMAL HIGH (ref 6.5–8.1)

## 2023-06-13 LAB — CBC
HCT: 47.4 % (ref 39.0–52.0)
Hemoglobin: 16.1 g/dL (ref 13.0–17.0)
MCH: 28.3 pg (ref 26.0–34.0)
MCHC: 34 g/dL (ref 30.0–36.0)
MCV: 83.5 fL (ref 80.0–100.0)
Platelets: 218 10*3/uL (ref 150–400)
RBC: 5.68 MIL/uL (ref 4.22–5.81)
RDW: 13.3 % (ref 11.5–15.5)
WBC: 9.2 10*3/uL (ref 4.0–10.5)
nRBC: 0 % (ref 0.0–0.2)

## 2023-06-13 LAB — URINALYSIS, ROUTINE W REFLEX MICROSCOPIC
Bilirubin Urine: NEGATIVE
Glucose, UA: NEGATIVE mg/dL
Ketones, ur: NEGATIVE mg/dL
Leukocytes,Ua: NEGATIVE
Nitrite: NEGATIVE
RBC / HPF: >50 RBC/hpf (ref 0–5)
Specific Gravity, Urine: 1.015 (ref 1.005–1.030)
pH: 5.5 (ref 5.0–8.0)

## 2023-06-13 LAB — LIPASE, BLOOD: Lipase: 40 U/L (ref 11–51)

## 2023-06-13 MED ORDER — TAMSULOSIN HCL 0.4 MG PO CAPS: 0.4000 mg | ORAL_CAPSULE | Freq: Every day | ORAL | 0 refills | Status: AC

## 2023-06-13 MED ORDER — SODIUM CHLORIDE 0.9 % IV BOLUS
1000.0000 mL | Freq: Once | INTRAVENOUS | Status: AC
  Administered 2023-06-13: 1000 mL via INTRAVENOUS

## 2023-06-13 MED ORDER — KETOROLAC TROMETHAMINE 30 MG/ML IJ SOLN
15.0000 mg | Freq: Once | INTRAMUSCULAR | Status: AC
  Administered 2023-06-13: 15 mg via INTRAVENOUS
  Filled 2023-06-13: qty 1
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
## 2023-06-13 NOTE — Discharge Instructions (Signed)
 You are seen in the emergency department for right flank pain Your urine showed some blood but no infection Your blood work looked okay This is most likely caused by kidney stone You felt better after with fluids and medication called Toradol You should take 600 mg ibuprofen  every 6 hours for discomfort Pick up the prescribed Flomax from your pharmacy and begin taking as directed to help move the kidney stone along Keep well-hydrated If your symptoms worsen follow-up with urology at the number listed Return to the emerged part for severe pain, if you are unable to urinate or have any other concerns

## 2023-06-13 NOTE — ED Triage Notes (Signed)
 Patient arrives POV with complaints of right lower abdominal/side pain that started today. Rates pain an 8/10. Patient does report drinking ETOH earlier this week.

## 2023-10-30 ENCOUNTER — Other Ambulatory Visit: Payer: Self-pay

## 2023-10-30 ENCOUNTER — Emergency Department (HOSPITAL_BASED_OUTPATIENT_CLINIC_OR_DEPARTMENT_OTHER)

## 2023-10-30 ENCOUNTER — Other Ambulatory Visit (HOSPITAL_BASED_OUTPATIENT_CLINIC_OR_DEPARTMENT_OTHER): Payer: Self-pay

## 2023-10-30 ENCOUNTER — Encounter (HOSPITAL_BASED_OUTPATIENT_CLINIC_OR_DEPARTMENT_OTHER): Payer: Self-pay

## 2023-10-30 ENCOUNTER — Emergency Department (HOSPITAL_BASED_OUTPATIENT_CLINIC_OR_DEPARTMENT_OTHER)
Admission: EM | Admit: 2023-10-30 | Discharge: 2023-10-30 | Disposition: A | Discharge status: Home / Self Care | Attending: Emergency Medicine | Admitting: Emergency Medicine

## 2023-10-30 DIAGNOSIS — N200 Calculus of kidney: Secondary | ICD-10-CM | POA: Diagnosis not present

## 2023-10-30 DIAGNOSIS — R350 Frequency of micturition: Secondary | ICD-10-CM | POA: Diagnosis present

## 2023-10-30 LAB — URINALYSIS, ROUTINE W REFLEX MICROSCOPIC
Bacteria, UA: NONE SEEN
Glucose, UA: NEGATIVE mg/dL
Ketones, ur: NEGATIVE mg/dL
Leukocytes,Ua: NEGATIVE
Nitrite: POSITIVE — AB
Protein, ur: 30 mg/dL — AB
Specific Gravity, Urine: 1.032 — ABNORMAL HIGH (ref 1.005–1.030)
pH: 6 (ref 5.0–8.0)

## 2023-10-30 LAB — COMPREHENSIVE METABOLIC PANEL WITH GFR
ALT: 21 U/L (ref 0–44)
AST: 27 U/L (ref 15–41)
Albumin: 5.1 g/dL — ABNORMAL HIGH (ref 3.5–5.0)
Alkaline Phosphatase: 67 U/L (ref 38–126)
Anion gap: 11 (ref 5–15)
BUN: 14 mg/dL (ref 6–20)
CO2: 25 mmol/L (ref 22–32)
Calcium: 10.3 mg/dL (ref 8.9–10.3)
Chloride: 102 mmol/L (ref 98–111)
Creatinine, Ser: 1.19 mg/dL (ref 0.61–1.24)
GFR, Estimated: >60 mL/min (ref >60)
Glucose, Bld: 96 mg/dL (ref 70–99)
Potassium: 4 mmol/L (ref 3.5–5.1)
Sodium: 139 mmol/L (ref 135–145)
Total Bilirubin: 0.8 mg/dL (ref 0.0–1.2)
Total Protein: 8.4 g/dL — ABNORMAL HIGH (ref 6.5–8.1)

## 2023-10-30 LAB — CBC WITH DIFFERENTIAL/PLATELET
Abs Immature Granulocytes: 0 K/uL (ref 0.00–0.07)
Basophils Absolute: 0 K/uL (ref 0.0–0.1)
Basophils Relative: 1 %
Eosinophils Absolute: 0.3 K/uL (ref 0.0–0.5)
Eosinophils Relative: 6 %
HCT: 46.3 % (ref 39.0–52.0)
Hemoglobin: 15.9 g/dL (ref 13.0–17.0)
Immature Granulocytes: 0 %
Lymphocytes Relative: 47 %
Lymphs Abs: 2 K/uL (ref 0.7–4.0)
MCH: 27.7 pg (ref 26.0–34.0)
MCHC: 34.3 g/dL (ref 30.0–36.0)
MCV: 80.8 fL (ref 80.0–100.0)
Monocytes Absolute: 0.4 K/uL (ref 0.1–1.0)
Monocytes Relative: 9 %
Neutro Abs: 1.6 K/uL — ABNORMAL LOW (ref 1.7–7.7)
Neutrophils Relative %: 37 %
Platelets: 230 K/uL (ref 150–400)
RBC: 5.73 MIL/uL (ref 4.22–5.81)
RDW: 13.7 % (ref 11.5–15.5)
WBC: 4.3 K/uL (ref 4.0–10.5)
nRBC: 0 % (ref 0.0–0.2)

## 2023-10-30 LAB — LIPASE, BLOOD: Lipase: 51 U/L (ref 11–51)

## 2023-10-30 MED ORDER — TAMSULOSIN HCL 0.4 MG PO CAPS
0.4000 mg | ORAL_CAPSULE | Freq: Every day | ORAL | 0 refills | Status: AC
  Filled 2023-10-30: qty 14, 14d supply, fill #0

## 2023-10-30 MED ORDER — HYDROCODONE-ACETAMINOPHEN 5-325 MG PO TABS
1.0000 | ORAL_TABLET | Freq: Four times a day (QID) | ORAL | 0 refills | Status: AC | PRN
  Filled 2023-10-30: qty 8, 2d supply, fill #0

## 2023-10-30 MED ORDER — KETOROLAC TROMETHAMINE 15 MG/ML IJ SOLN
15.0000 mg | Freq: Once | INTRAMUSCULAR | Status: AC
  Administered 2023-10-30: 15 mg via INTRAVENOUS
  Filled 2023-10-30: qty 1

## 2023-10-30 NOTE — ED Notes (Signed)
 Spoke with lab to add on urine culture.

## 2023-10-30 NOTE — Discharge Instructions (Addendum)
 It was a pleasure taking care of you today.  As discussed, you have a 3 mm stone which is likely causing your pain.  I am sending you home with Flomax .  Take daily until stone has passed.  I am also sending you home with pain medication.  Save for severe pain.  Pain medication can cause drowsiness so do not drive or operate machinery while on the medication.  I have included the number of urology.  Call to schedule an appointment for follow-up.  Return to the ER for any worsening symptoms.

## 2023-10-30 NOTE — ED Provider Notes (Signed)
 [facility] AT [facility] Provider Note   CSN: [id number] Arrival date & time: 10/30/23  0901     Patient presents with: Urinary Frequency and Flank Pain (right)   Daniel Figueroa is a 33 y.o. male with no significant past medical history who presents to the ED due to right flank pain associated with increased urinary frequency and dysuria x 1 week.  Patient states symptoms started with increased frequency and urgency with urination.  Denies penile discharge.  No concern for STIs.  Denies nausea, vomiting, diarrhea.  No fever or chills.  Patient was seen in the ED in May 2025 with similar symptoms and told he could possibly have a kidney stone due to hematuria.  No CT performed at that time.  Denies testicular pain or edema. No rash.   History obtained from patient and past medical records. No interpreter used during encounter.      Prior to Admission medications   Medication Sig Start Date End Date Taking? Authorizing Provider  HYDROcodone -acetaminophen  (NORCO/VICODIN) 5-325 MG tablet Take 1 tablet by mouth every 6 (six) hours as needed. 10/30/23  Yes Clorene Nerio, Aleck BROCKS, PA-C  tamsulosin  (FLOMAX ) 0.4 MG CAPS capsule Take 1 capsule (0.4 mg total) by mouth daily. 10/30/23  Yes Brielyn Bosak, Aleck C, PA-C  bacitracin  ointment Apply 1 Application topically 2 (two) times daily. Apply to burn 08/17/21   Cardama, Raynell Moder, MD  doxycycline  (VIBRAMYCIN ) 100 MG capsule Take 1 capsule (100 mg total) by mouth 2 (two) times daily. 02/16/16   Vicky Charleston, PA-C  ibuprofen  (ADVIL ,MOTRIN ) 800 MG tablet Take 1 tablet (800 mg total) by mouth 3 (three) times daily. 12/23/17   Vicky Charleston, PA-C  tamsulosin  (FLOMAX ) 0.4 MG CAPS capsule Take 1 capsule (0.4 mg total) by mouth daily. 06/13/23   Pamella Ozell LABOR, DO    Allergies: Patient has no known allergies.    Review of Systems  Gastrointestinal:  Negative for abdominal pain.  Genitourinary:  Positive for dysuria,  flank pain and frequency.    Updated Vital Signs BP (!) 151/102 (BP Location: Right Arm)   Pulse 84   Temp 98.1 F (36.7 C) (Oral)   Resp 20   Ht 5' 11 (1.803 m)   Wt 88.5 kg   SpO2 100%   BMI 27.20 kg/m   Physical Exam Vitals and nursing note reviewed.  Constitutional:      General: He is not in acute distress.    Appearance: He is not ill-appearing.  HENT:     Head: Normocephalic.  Eyes:     Pupils: Pupils are equal, round, and reactive to light.  Cardiovascular:     Rate and Rhythm: Normal rate and regular rhythm.     Pulses: Normal pulses.     Heart sounds: Normal heart sounds. No murmur heard.    No friction rub. No gallop.  Pulmonary:     Effort: Pulmonary effort is normal.     Breath sounds: Normal breath sounds.  Abdominal:     General: Abdomen is flat. There is no distension.     Palpations: Abdomen is soft.     Tenderness: There is no abdominal tenderness. There is no guarding or rebound.     Comments: No CVA tenderness bilaterally.  Musculoskeletal:        General: Normal range of motion.     Cervical back: Neck supple.  Skin:    General: Skin is warm and dry.  Neurological:     General: No  focal deficit present.     Mental Status: He is alert.  Psychiatric:        Mood and Affect: Mood normal.        Behavior: Behavior normal.     (all labs ordered are listed, but only abnormal results are displayed) Labs Reviewed  URINALYSIS, ROUTINE W REFLEX MICROSCOPIC - Abnormal; Notable for the following components:      Result Value   Color, Urine ORANGE (*)    Specific Gravity, Urine 1.032 (*)    Hgb urine dipstick TRACE (*)    Bilirubin Urine MODERATE (*)    Protein, ur 30 (*)    Nitrite POSITIVE (*)    All other components within normal limits  CBC WITH DIFFERENTIAL/PLATELET - Abnormal; Notable for the following components:   Neutro Abs 1.6 (*)    All other components within normal limits  COMPREHENSIVE METABOLIC PANEL WITH GFR - Abnormal; Notable  for the following components:   Total Protein 8.4 (*)    Albumin 5.1 (*)    All other components within normal limits  URINE CULTURE  LIPASE, BLOOD    EKG: None  Radiology: CT Renal Stone Study Result Date: 10/30/2023 CLINICAL DATA:  Right flank pain. Urinary frequency. Kidney stones suspected. EXAM: CT ABDOMEN AND PELVIS WITHOUT CONTRAST TECHNIQUE: Multidetector CT imaging of the abdomen and pelvis was performed following the standard protocol without IV contrast. RADIATION DOSE REDUCTION: This exam was performed according to the departmental dose-optimization program which includes automated exposure control, adjustment of the mA and/or kV according to patient size and/or use of iterative reconstruction technique. COMPARISON:  08/17/2021 FINDINGS: Lower chest: Unremarkable. Hepatobiliary: No suspicious focal abnormality in the liver on this study without intravenous contrast. There is no evidence for gallstones, gallbladder wall thickening, or pericholecystic fluid. No intrahepatic or extrahepatic biliary dilation. Pancreas: No focal mass lesion. No dilatation of the main duct. No intraparenchymal cyst. No peripancreatic edema. Spleen: No splenomegaly. No suspicious focal mass lesion. Adrenals/Urinary Tract: No adrenal nodule or mass. Tiny adjacent 1 mm stones in the lower pole right kidney are best seen on coronal image 45 of series 4. There is mild fullness of the right intrarenal collecting system and ureter down to the pelvis where a 2 x 2 x 3 mm stone is identified in the right UVJ (76/2). Left kidney and ureter are unremarkable. Bladder is decompressed. Stomach/Bowel: Stomach is unremarkable. No gastric wall thickening. No evidence of outlet obstruction. Duodenum is normally positioned as is the ligament of Treitz. No small bowel wall thickening. No small bowel dilatation. The terminal ileum is normal. The appendix is normal. No gross colonic mass. No colonic wall thickening. Vascular/Lymphatic:  No abdominal aortic aneurysm. No abdominal aortic atherosclerotic calcification. There is no gastrohepatic or hepatoduodenal ligament lymphadenopathy. No retroperitoneal or mesenteric lymphadenopathy. No pelvic sidewall lymphadenopathy. Reproductive: The prostate gland and seminal vesicles are unremarkable. Other: No intraperitoneal free fluid. Musculoskeletal: No worrisome lytic or sclerotic osseous abnormality. IMPRESSION: 1. 2 x 2 x 3 mm right UVJ stone with mild fullness of the right intrarenal collecting system and ureter. 2. Tiny adjacent 1 mm stones in the lower pole right kidney. Electronically Signed   By: Camellia Candle M.D.   On: 10/30/2023 09:44     Procedures   Medications Ordered in the ED  ketorolac  (TORADOL ) 15 MG/ML injection 15 mg (15 mg Intravenous Given 10/30/23 0935)  Medical Decision Making Amount and/or Complexity of Data Reviewed Labs: ordered. Decision-making details documented in ED Course. Radiology: ordered and independent interpretation performed. Decision-making details documented in ED Course.  Risk Prescription drug management.   This patient presents to the ED for concern of flank pain, this involves an extensive number of treatment options, and is a complaint that carries with it a high risk of complications and morbidity.  The differential diagnosis includes kidney stone, pyelonephritis, MSK etiology, shingles, etc  33 year old male presents to the ED due to right-sided flank pain associated with increased urinary frequency and dysuria x 1 week.  Possibly had a kidney stone in May 2025 due to hematuria on UA.  No known medical conditions.  Upon arrival, stable vitals.  Patient well-appearing on exam.  No CVA tenderness bilaterally.  No rash to suggest shingles.  Patient denies any testicular pain or edema.  Low suspicion for testicular torsion.  Abdomen soft, nondistended, nontender.  Low suspicion for acute abdomen.  Routine  labs ordered.  CT renal side rule out kidney stone.  Toradol  given. Denies chest pain and shortness of breath. Low suspicion for PE.   CBC with no leukocytosis.  Normal hemoglobin.  CMP with normal renal function.  No major electrolyte derangements.  UA positive for nitrates, bilirubin, and trace hematuria.  Does have orange color appearance.  Patient notes he did take Azo prior to arrival which is likely causing the discoloration.  21-50 white blood cells.  No bacteria.  CT renal study personally reviewed and interpreted which demonstrates a 3 mm right UVJ stone.  Discussed with Dr. Cam with urology who does not believe patient has an infected kidney stone.  Recommends discharging patient with Flomax  and pain medication.  Prescription sent.  Urology follow-up discussed with patient.  Patient stable for discharge.  No evidence of sepsis.  Patient well-appearing on exam and pain controlled after Toradol . Strict ED precautions discussed with patient. Patient states understanding and agrees to plan. Patient discharged home in no acute distress and stable vitals  Co morbidities that complicate the patient evaluation  No medication conditions   Social Determinants of Health:  Has PCP  Test / Admission - Considered:  Considered admission; however urology had lower suspicion for infected kidney stone. Patient stable for outpatient treatment     Final diagnoses:  Kidney stone    ED Discharge Orders          Ordered    tamsulosin  (FLOMAX ) 0.4 MG CAPS capsule  Daily        10/30/23 1119    HYDROcodone -acetaminophen  (NORCO/VICODIN) 5-325 MG tablet  Every 6 hours PRN        10/30/23 1119               Adriena Manfre C, PA-C 10/30/23 1121    Doretha Folks, MD 10/30/23 [phone]

## 2023-10-30 NOTE — ED Triage Notes (Addendum)
 Arrives ambulatory to the ED with complaints of urinary frequency and right side flank pain pain x1 week. Patient has been taking Azo for his urinary symptoms with minimal relief.

## 2023-10-31 LAB — URINE CULTURE: Culture: NO GROWTH
